# Patient Record
Sex: Male | Born: 1995 | Race: Black or African American | Hispanic: No | Marital: Single
Health system: Southern US, Community
[De-identification: ages and names within clinical notes are randomized; demographics above are authoritative.]

---
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
## 2015-07-19 IMAGING — CR DG FINGER RING 2+V*R*
1 series · 1 of 1 positions shown · non-contrast
Comparison: None.

CLINICAL DATA: Right ring finger injury and pain.

EXAM:
RIGHT RING FINGER 2+V

[view not recorded]
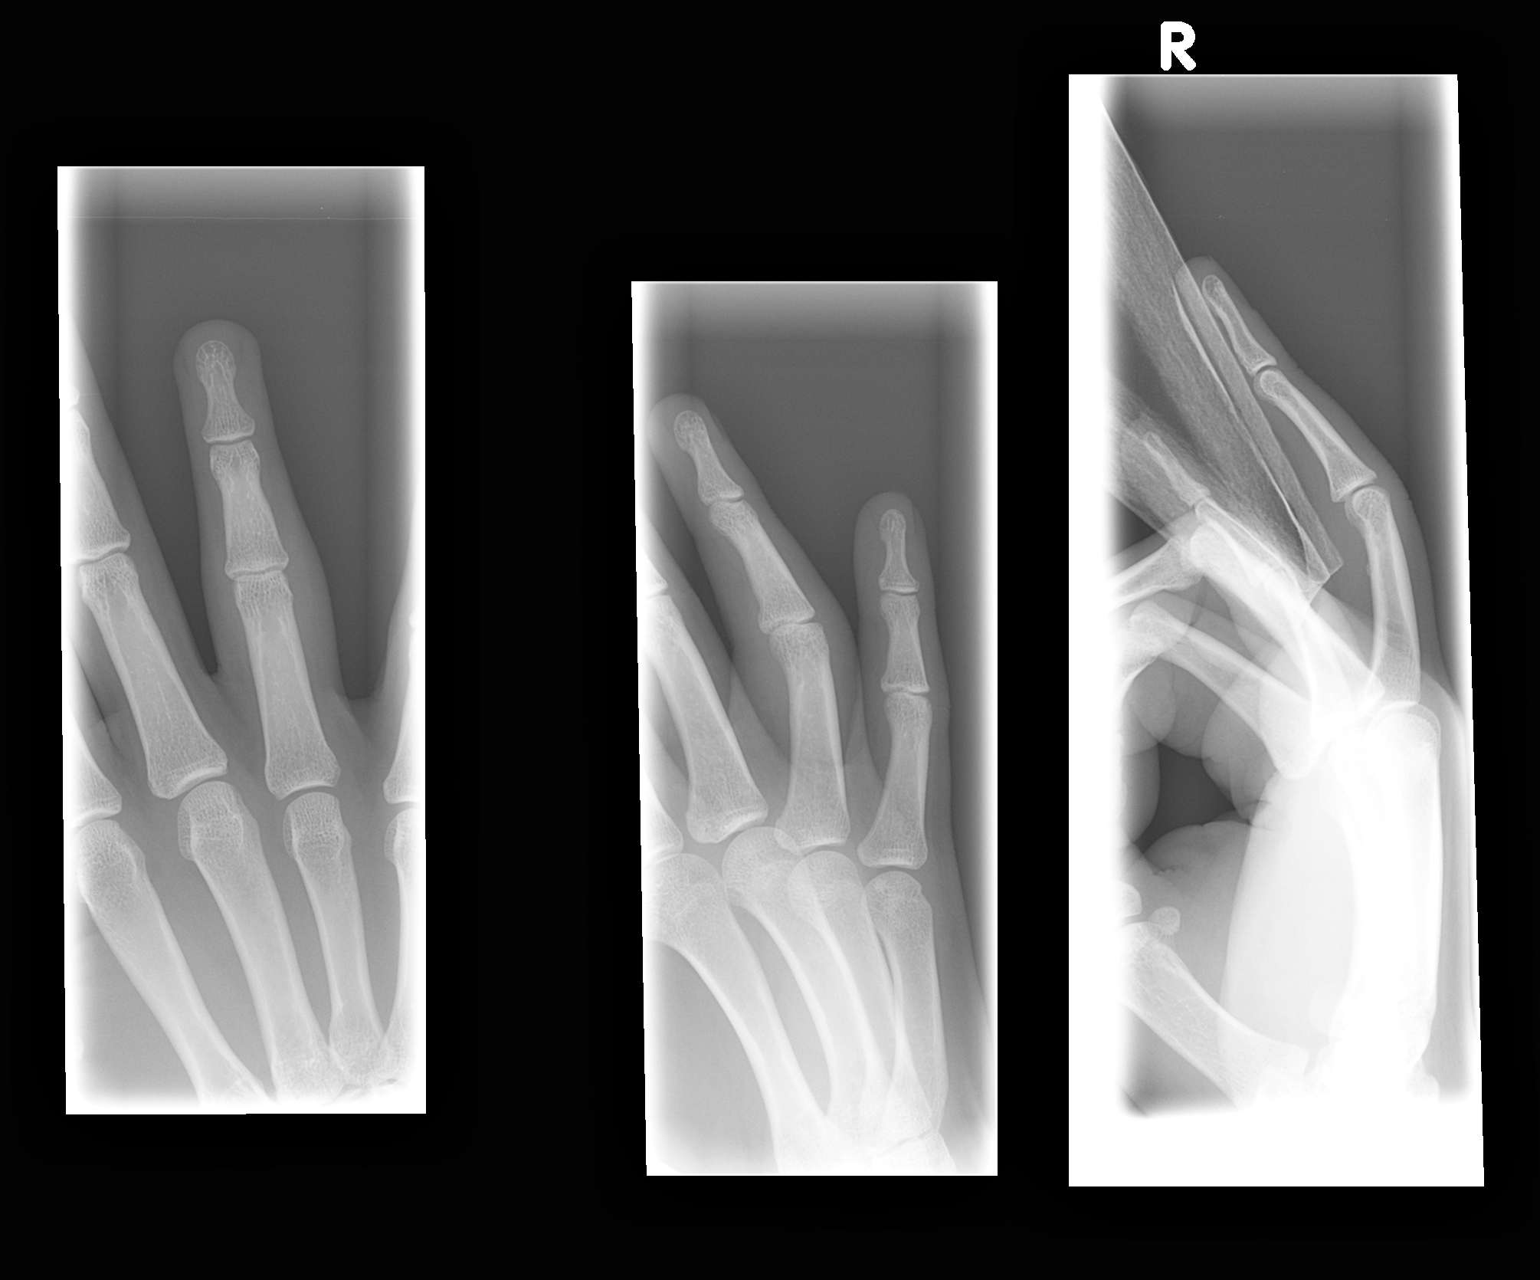

[1 of 1 positions shown; findings below may reference images not displayed]

FINDINGS: There is no evidence of fracture, subluxation or dislocation.

Mild soft tissue swelling overlying the PIP joint is noted.

No focal bony lesions are identified.
IMPRESSION: Mild soft tissue swelling without bony abnormality.

## 2018-12-09 DIAGNOSIS — E6609 Other obesity due to excess calories: Secondary | ICD-10-CM | POA: Diagnosis not present

## 2018-12-09 DIAGNOSIS — Z1389 Encounter for screening for other disorder: Secondary | ICD-10-CM | POA: Diagnosis not present

## 2018-12-09 DIAGNOSIS — Z0001 Encounter for general adult medical examination with abnormal findings: Secondary | ICD-10-CM | POA: Diagnosis not present

## 2018-12-09 DIAGNOSIS — Z6835 Body mass index (BMI) 35.0-35.9, adult: Secondary | ICD-10-CM | POA: Diagnosis not present

## 2018-12-09 DIAGNOSIS — J302 Other seasonal allergic rhinitis: Secondary | ICD-10-CM | POA: Diagnosis not present

## 2018-12-09 DIAGNOSIS — F909 Attention-deficit hyperactivity disorder, unspecified type: Secondary | ICD-10-CM | POA: Diagnosis not present

## 2019-12-15 DIAGNOSIS — E669 Obesity, unspecified: Secondary | ICD-10-CM | POA: Diagnosis not present

## 2019-12-15 DIAGNOSIS — F909 Attention-deficit hyperactivity disorder, unspecified type: Secondary | ICD-10-CM | POA: Diagnosis not present

## 2019-12-15 DIAGNOSIS — Z1331 Encounter for screening for depression: Secondary | ICD-10-CM | POA: Diagnosis not present

## 2019-12-15 DIAGNOSIS — J302 Other seasonal allergic rhinitis: Secondary | ICD-10-CM | POA: Diagnosis not present

## 2019-12-15 DIAGNOSIS — Z6837 Body mass index (BMI) 37.0-37.9, adult: Secondary | ICD-10-CM | POA: Diagnosis not present

## 2019-12-15 DIAGNOSIS — Z1389 Encounter for screening for other disorder: Secondary | ICD-10-CM | POA: Diagnosis not present

## 2019-12-15 DIAGNOSIS — Z0001 Encounter for general adult medical examination with abnormal findings: Secondary | ICD-10-CM | POA: Diagnosis not present

## 2020-01-15 ENCOUNTER — Other Ambulatory Visit: Payer: Self-pay

## 2020-01-15 DIAGNOSIS — Z20822 Contact with and (suspected) exposure to covid-19: Secondary | ICD-10-CM

## 2020-01-17 LAB — NOVEL CORONAVIRUS, NAA: SARS-CoV-2, NAA: NOT DETECTED

## 2020-01-17 LAB — SARS-COV-2, NAA 2 DAY TAT
